# Patient Record
Sex: Male | Born: 1990 | Race: Black or African American | Hispanic: No | Marital: Single | Smoking: Never smoker
Health system: Southern US, Community
[De-identification: ages and names within clinical notes are randomized; demographics above are authoritative.]

## PROBLEM LIST (undated history)

## (undated) DIAGNOSIS — L309 Dermatitis, unspecified: Secondary | ICD-10-CM

## (undated) DIAGNOSIS — J45909 Unspecified asthma, uncomplicated: Secondary | ICD-10-CM

## (undated) HISTORY — PX: SINUS EXPLORATION: SHX5214

---

## 2010-12-06 ENCOUNTER — Inpatient Hospital Stay (INDEPENDENT_AMBULATORY_CARE_PROVIDER_SITE_OTHER)
Admission: RE | Admit: 2010-12-06 | Discharge: 2010-12-06 | Disposition: A | Discharge status: Home / Self Care | Payer: PRIVATE HEALTH INSURANCE | Source: Ambulatory Visit | Attending: Family Medicine | Admitting: Family Medicine

## 2010-12-06 ENCOUNTER — Ambulatory Visit (INDEPENDENT_AMBULATORY_CARE_PROVIDER_SITE_OTHER): Payer: PRIVATE HEALTH INSURANCE

## 2010-12-06 DIAGNOSIS — J40 Bronchitis, not specified as acute or chronic: Secondary | ICD-10-CM

## 2010-12-06 LAB — CBC
HCT: 41.4 % (ref 39.0–52.0)
MCV: 88.3 fL (ref 78.0–100.0)
RDW: 12.7 % (ref 11.5–15.5)
WBC: 11.8 10*3/uL — ABNORMAL HIGH (ref 4.0–10.5)

## 2010-12-06 LAB — DIFFERENTIAL
Eosinophils Relative: 0 % (ref 0–5)
Lymphocytes Relative: 4 % — ABNORMAL LOW (ref 12–46)
Lymphs Abs: 0.5 10*3/uL — ABNORMAL LOW (ref 0.7–4.0)
Monocytes Absolute: 0.6 10*3/uL (ref 0.1–1.0)

## 2013-04-12 ENCOUNTER — Emergency Department (HOSPITAL_COMMUNITY)
Admission: EM | Admit: 2013-04-12 | Discharge: 2013-04-12 | Disposition: A | Discharge status: Home / Self Care | Payer: PRIVATE HEALTH INSURANCE | Source: Home / Self Care | Attending: Family Medicine | Admitting: Family Medicine

## 2013-04-12 ENCOUNTER — Encounter (HOSPITAL_COMMUNITY): Payer: Self-pay | Admitting: Emergency Medicine

## 2013-04-12 DIAGNOSIS — L209 Atopic dermatitis, unspecified: Secondary | ICD-10-CM

## 2013-04-12 DIAGNOSIS — L2089 Other atopic dermatitis: Secondary | ICD-10-CM

## 2013-04-12 HISTORY — DX: Unspecified asthma, uncomplicated: J45.909

## 2013-04-12 HISTORY — DX: Dermatitis, unspecified: L30.9

## 2013-04-12 MED ORDER — PREDNISONE 20 MG PO TABS: 40.0000 mg | ORAL_TABLET | Freq: Every day | ORAL | Status: AC

## 2013-04-12 MED ORDER — TRIAMCINOLONE ACETONIDE 0.1 % EX CREA: TOPICAL_CREAM | Freq: Two times a day (BID) | CUTANEOUS | Status: DC

## 2013-04-12 NOTE — ED Provider Notes (Signed)
   History    CSN: [id number] Arrival date & time 04/12/13  1636  First MD Initiated Contact with Patient 04/12/13 1728     Chief Complaint  Patient presents with  . Eczema    flare up symptoms started saturday. c/o severe itching and skin tighting.    (Consider location/radiation/quality/duration/timing/severity/associated sxs/prior Treatment) HPI Comments: Dustin Shepherd is a 22 year old male with a history of atopic dermatitis and asthma. He is followed by a dermatologist at [facility] who is currently treating him with tacrolimus. He presents today with 2 days of face neck and chest itching. This is associated with an erythematous rash. He admits to attending a cook out over weekend and possibly being exposed to outdoor irritants. Along with the pruritus associated symptoms include skin warmth and mild shortness of breath He denies fever and tongue or lip swelling.   He is taking Benadryl with improvement in periorbital swelling and itching.   The history is provided by the patient. No language interpreter was used.   Past Medical History  Diagnosis Date  . Asthma   . Eczema    Past Surgical History  Procedure Laterality Date  . Sinus exploration     History reviewed. No pertinent family history. History  Substance Use Topics  . Smoking status: Never Smoker   . Smokeless tobacco: Not on file  . Alcohol Use: Yes    Review of Systems  Constitutional: Negative.   Respiratory: Positive for shortness of breath. Negative for cough.   Skin: Positive for rash.    Allergies  Keflex and Peanuts  Home Medications   Current Outpatient Rx  Name  Route  Sig  Dispense  Refill  . ALBUTEROL SULFATE ER PO   Oral   Take by mouth.         . Tacrolimus (PROTOPIC EX)   Apply externally   Apply topically.          BP 136/84  Pulse 64  Temp(Src) 98 F (36.7 C) (Oral)  Resp 14  SpO2 100% Physical Exam  Constitutional: He appears well-developed and well-nourished. No distress.   Skin: Rash noted. There is erythema.   patient's skin on his face, neck and trunk is diffusely atrophic and erythematous. There is evidence of excoriation. There are no pustules are weeping areas  ED Course  Procedures (including critical care time) Labs Reviewed - No data to display No results found. No diagnosis found.  MDM  Atopic dermatitis with acute flare and no evidence of bacterial super infection. Treat the patient with prednisone 40 mg daily for the next week. He to followup with his primary dermatologist to determine the 40 mg he is to be continued or taper to be started. Have also prescribed a moderate potency topical corticosteroid ointment. Patient encouraged to continue Benadryl nightly. Patient caution against scratching as that increases her risk of bacterial superinfection.  Dessa Phi, MD 04/12/13 1819

## 2013-04-12 NOTE — ED Notes (Signed)
Pt c/o eczema flare up with severe itching and skin tightness.  Pt denies change in diet or any soaps/detergents.  Hx of eczema  Pt states that he took benedryl that usually helps but it did not work this time.

## 2013-04-12 NOTE — ED Notes (Signed)
Waiting discharge papers 

## 2013-04-26 NOTE — ED Provider Notes (Signed)
Medical screening examination/treatment/procedure(s) were performed by resident physician or non-physician practitioner and as supervising physician I was immediately available for consultation/collaboration.   Koua Deeg DOUGLAS MD.   Bradden Tadros D Saanya Zieske, MD 04/26/13 0841 

## 2013-07-13 ENCOUNTER — Emergency Department (HOSPITAL_COMMUNITY)
Admission: EM | Admit: 2013-07-13 | Discharge: 2013-07-13 | Disposition: A | Discharge status: Home / Self Care | Payer: PRIVATE HEALTH INSURANCE | Source: Home / Self Care | Attending: Family Medicine | Admitting: Family Medicine

## 2013-07-13 ENCOUNTER — Encounter (HOSPITAL_COMMUNITY): Payer: Self-pay | Admitting: Emergency Medicine

## 2013-07-13 DIAGNOSIS — J302 Other seasonal allergic rhinitis: Secondary | ICD-10-CM

## 2013-07-13 DIAGNOSIS — J309 Allergic rhinitis, unspecified: Secondary | ICD-10-CM

## 2013-07-13 DIAGNOSIS — J45901 Unspecified asthma with (acute) exacerbation: Secondary | ICD-10-CM

## 2013-07-13 MED ORDER — IPRATROPIUM BROMIDE 0.02 % IN SOLN
0.5000 mg | Freq: Once | RESPIRATORY_TRACT | Status: AC
  Administered 2013-07-13: 0.5 mg via RESPIRATORY_TRACT

## 2013-07-13 MED ORDER — METHYLPREDNISOLONE ACETATE 40 MG/ML IJ SUSP
80.0000 mg | Freq: Once | INTRAMUSCULAR | Status: AC
  Administered 2013-07-13: 80 mg via INTRAMUSCULAR

## 2013-07-13 MED ORDER — ALBUTEROL SULFATE HFA 108 (90 BASE) MCG/ACT IN AERS: 2.0000 | INHALATION_SPRAY | Freq: Four times a day (QID) | RESPIRATORY_TRACT | Status: AC | PRN

## 2013-07-13 MED ORDER — FLUTICASONE PROPIONATE 50 MCG/ACT NA SUSP: 2.0000 | Freq: Two times a day (BID) | NASAL | Status: AC

## 2013-07-13 MED ORDER — METHYLPREDNISOLONE ACETATE 80 MG/ML IJ SUSP
INTRAMUSCULAR | Status: AC
  Filled 2013-07-13: qty 1

## 2013-07-13 MED ORDER — TRIAMCINOLONE ACETONIDE 40 MG/ML IJ SUSP
INTRAMUSCULAR | Status: AC
  Filled 2013-07-13: qty 1

## 2013-07-13 MED ORDER — ALBUTEROL SULFATE (5 MG/ML) 0.5% IN NEBU
5.0000 mg | INHALATION_SOLUTION | Freq: Once | RESPIRATORY_TRACT | Status: AC
  Administered 2013-07-13: 5 mg via RESPIRATORY_TRACT

## 2013-07-13 MED ORDER — ALBUTEROL SULFATE (5 MG/ML) 0.5% IN NEBU
INHALATION_SOLUTION | RESPIRATORY_TRACT | Status: AC
  Filled 2013-07-13: qty 1

## 2013-07-13 MED ORDER — TRIAMCINOLONE ACETONIDE 40 MG/ML IJ SUSP
40.0000 mg | Freq: Once | INTRAMUSCULAR | Status: AC
  Administered 2013-07-13: 40 mg via INTRAMUSCULAR

## 2013-07-13 NOTE — ED Provider Notes (Signed)
CSN: [id number]     Arrival date & time 07/13/13  1805 History   First MD Initiated Contact with Patient 07/13/13 1855     Chief Complaint  Patient presents with  . URI   (Consider location/radiation/quality/duration/timing/severity/associated sxs/prior Treatment) Patient is a 22 y.o. male presenting with URI. The history is provided by the patient.  URI Presenting symptoms: congestion, cough and rhinorrhea   Presenting symptoms: no fever   Severity:  Mild Onset quality:  Gradual Duration:  2 days Timing:  Sporadic Progression:  Waxing and waning Chronicity:  Chronic Relieved by:  Nebulizer treatments Associated symptoms: wheezing     Past Medical History  Diagnosis Date  . Asthma   . Eczema    Past Surgical History  Procedure Laterality Date  . Sinus exploration     History reviewed. No pertinent family history. History  Substance Use Topics  . Smoking status: Never Smoker   . Smokeless tobacco: Not on file  . Alcohol Use: Yes    Review of Systems  Constitutional: Negative.  Negative for fever.  HENT: Positive for congestion, rhinorrhea and postnasal drip.   Respiratory: Positive for cough and wheezing.     Allergies  Keflex and Peanuts  Home Medications   Current Outpatient Rx  Name  Route  Sig  Dispense  Refill  . albuterol (PROVENTIL HFA;VENTOLIN HFA) 108 (90 BASE) MCG/ACT inhaler   Inhalation   Inhale 2 puffs into the lungs every 6 (six) hours as needed for wheezing.   1 Inhaler   0   . ALBUTEROL SULFATE ER PO   Oral   Take by mouth.         . fluticasone (FLONASE) 50 MCG/ACT nasal spray   Nasal   Place 2 sprays into the nose 2 (two) times daily.   1 g   2   . Tacrolimus (PROTOPIC EX)   Apply externally   Apply topically.         . triamcinolone cream (KENALOG) 0.1 %   Topical   Apply topically 2 (two) times daily.   85.2 g   0    BP 108/69  Pulse 78  Temp(Src) 98.2 F (36.8 C) (Oral)  Resp 21  SpO2 99% Physical Exam   Nursing note and vitals reviewed. Constitutional: He is oriented to person, place, and time. He appears well-developed and well-nourished.  HENT:  Head: Normocephalic.  Right Ear: External ear normal.  Left Ear: External ear normal.  Mouth/Throat: Oropharynx is clear and moist.  Eyes: Pupils are equal, round, and reactive to light.  Neck: Normal range of motion. Neck supple.  Cardiovascular: Normal rate, regular rhythm, normal heart sounds and intact distal pulses.   Pulmonary/Chest: Effort normal and breath sounds normal. He has no wheezes.  Lymphadenopathy:    He has no cervical adenopathy.  Neurological: He is alert and oriented to person, place, and time.  Skin: Skin is warm and dry.    ED Course  Procedures (including critical care time) Labs Review Labs Reviewed - No data to display Imaging Review No results found.  MDM      Linna Hoff, MD 07/13/13 [id number]

## 2013-07-13 NOTE — ED Notes (Signed)
Pt c/o of sx including productive cough with sneezing and congestion x 2 days. Pt reports sx are exacerbating his asthma to the point when he wakes up from sleeping. Last used his pulmacort this afternoon around 4 pm. Pt denies fever and n/v/d/. Pt is alert and in no acute distress.

## 2013-12-09 ENCOUNTER — Emergency Department (HOSPITAL_COMMUNITY)
Admission: EM | Admit: 2013-12-09 | Discharge: 2013-12-09 | Disposition: A | Discharge status: Home / Self Care | Payer: PRIVATE HEALTH INSURANCE | Source: Home / Self Care

## 2013-12-09 ENCOUNTER — Encounter (HOSPITAL_COMMUNITY): Payer: Self-pay | Admitting: Emergency Medicine

## 2013-12-09 DIAGNOSIS — J45901 Unspecified asthma with (acute) exacerbation: Secondary | ICD-10-CM

## 2013-12-09 MED ORDER — IPRATROPIUM BROMIDE 0.02 % IN SOLN
RESPIRATORY_TRACT | Status: AC
  Filled 2013-12-09: qty 2.5

## 2013-12-09 MED ORDER — ALBUTEROL SULFATE HFA 108 (90 BASE) MCG/ACT IN AERS: 1.0000 | INHALATION_SPRAY | Freq: Four times a day (QID) | RESPIRATORY_TRACT | Status: AC | PRN

## 2013-12-09 MED ORDER — IPRATROPIUM BROMIDE 0.02 % IN SOLN
0.5000 mg | Freq: Once | RESPIRATORY_TRACT | Status: AC
  Administered 2013-12-09: 0.5 mg via RESPIRATORY_TRACT

## 2013-12-09 MED ORDER — ALBUTEROL SULFATE (2.5 MG/3ML) 0.083% IN NEBU
5.0000 mg | INHALATION_SOLUTION | Freq: Once | RESPIRATORY_TRACT | Status: AC
  Administered 2013-12-09: 5 mg via RESPIRATORY_TRACT

## 2013-12-09 MED ORDER — ALBUTEROL SULFATE (2.5 MG/3ML) 0.083% IN NEBU
INHALATION_SOLUTION | RESPIRATORY_TRACT | Status: AC
  Filled 2013-12-09: qty 6

## 2013-12-09 NOTE — Discharge Instructions (Signed)
Asthma, Adult °Asthma is a recurring condition in which the airways tighten and narrow. Asthma can make it difficult to breathe. It can cause coughing, wheezing, and shortness of breath. Asthma episodes (also called asthma attacks) range from minor to life-threatening. Asthma cannot be cured, but medicines and lifestyle changes can help control it. °CAUSES °Asthma is believed to be caused by inherited (genetic) and environmental factors, but its exact cause is unknown. Asthma may be triggered by allergens, lung infections, or irritants in the air. Asthma triggers are different for each person. Common triggers include:  °· Animal dander. °· Dust mites. °· Cockroaches. °· Pollen from trees or grass. °· Mold. °· Smoke. °· Air pollutants such as dust, household cleaners, hair sprays, aerosol sprays, paint fumes, strong chemicals, or strong odors. °· Cold air, weather changes, and winds (which increase molds and pollens in the air). °· Strong emotional expressions such as crying or laughing hard. °· Stress. °· Certain medicines (such as aspirin) or types of drugs (such as beta-blockers). °· Sulfites in foods and drinks. Foods and drinks that may contain sulfites include dried fruit, potato chips, and sparkling grape juice. °· Infections or inflammatory conditions such as the flu, a cold, or an inflammation of the nasal membranes (rhinitis). °· Gastroesophageal reflux disease (GERD). °· Exercise or strenuous activity. °SYMPTOMS °Symptoms may occur immediately after asthma is triggered or many hours later. Symptoms include: °· Wheezing. °· Excessive nighttime or early morning coughing. °· Frequent or severe coughing with a common cold. °· Chest tightness. °· Shortness of breath. °DIAGNOSIS  °The diagnosis of asthma is made by a review of your medical history and a physical exam. Tests may also be performed. These may include: °· Lung function studies. These tests show how much air you breath in and out. °· Allergy  tests. °· Imaging tests such as X-rays. °TREATMENT  °Asthma cannot be cured, but it can usually be controlled. Treatment involves identifying and avoiding your asthma triggers. It also involves medicines. There are 2 classes of medicine used for asthma treatment:  °· Controller medicines. These prevent asthma symptoms from occurring. They are usually taken every day. °· Reliever or rescue medicines. These quickly relieve asthma symptoms. They are used as needed and provide short-term relief. °Your health care provider will help you create an asthma action plan. An asthma action plan is a written plan for managing and treating your asthma attacks. It includes a list of your asthma triggers and how they may be avoided. It also includes information on when medicines should be taken and when their dosage should be changed. An action plan may also involve the use of a device called a peak flow meter. A peak flow meter measures how well the lungs are working. It helps you monitor your condition. °HOME CARE INSTRUCTIONS  °· Take medicine as directed by your health care provider. Speak with your health care provider if you have questions about how or when to take the medicines. °· Use a peak flow meter as directed by your health care provider. Record and keep track of readings. °· Understand and use the action plan to help minimize or stop an asthma attack without needing to seek medical care. °· Control your home environment in the following ways to help prevent asthma attacks: °· Do not smoke. Avoid being exposed to secondhand smoke. °· Change your heating and air conditioning filter regularly. °· Limit your use of fireplaces and wood stoves. °· Get rid of pests (such as roaches and   mice) and their droppings. °· Throw away plants if you see mold on them. °· Clean your floors and dust regularly. Use unscented cleaning products. °· Try to have someone else vacuum for you regularly. Stay out of rooms while they are being  vacuumed and for a short while afterward. If you vacuum, use a dust mask from a hardware store, a double-layered or microfilter vacuum cleaner bag, or a vacuum cleaner with a HEPA filter. °· Replace carpet with wood, tile, or vinyl flooring. Carpet can trap dander and dust. °· Use allergy-proof pillows, mattress covers, and box spring covers. °· Wash bed sheets and blankets every week in hot water and dry them in a dryer. °· Use blankets that are made of polyester or cotton. °· Clean bathrooms and kitchens with bleach. If possible, have someone repaint the walls in these rooms with mold-resistant paint. Keep out of the rooms that are being cleaned and painted. °· Wash hands frequently. °SEEK MEDICAL CARE IF:  °· You have wheezing, shortness of breath, or a cough even if taking medicine to prevent attacks. °· The colored mucus you cough up (sputum) is thicker than usual. °· Your sputum changes from clear or white to yellow, green, gray, or bloody. °· You have any problems that may be related to the medicines you are taking (such as a rash, itching, swelling, or trouble breathing). °· You are using a reliever medicine more than 2 3 times per week. °· Your peak flow is still at 50 79% of you personal best after following your action plan for 1 hour. °SEEK IMMEDIATE MEDICAL CARE IF:  °· You seem to be getting worse and are unresponsive to treatment during an asthma attack. °· You are short of breath even at rest. °· You get short of breath when doing very little physical activity. °· You have difficulty eating, drinking, or talking due to asthma symptoms. °· You develop chest pain. °· You develop a fast heartbeat. °· You have a bluish color to your lips or fingernails. °· You are lightheaded, dizzy, or faint. °· Your peak flow is less than 50% of your personal best. °· You have a fever or persistent symptoms for more than 2 3 days. °· You have a fever and symptoms suddenly get worse. °MAKE SURE YOU:  °· Understand these  instructions. °· Will watch your condition. °· Will get help right away if you are not doing well or get worse. °Document Released: 10/06/2005 Document Revised: 06/08/2013 Document Reviewed: 05/05/2013 °[employer]® Patient Information ©2014 [employer]. ° °Asthma Attack Prevention °Although there is no way to prevent asthma from starting, you can take steps to control the disease and reduce its symptoms. Learn about your asthma and how to control it. Take an active role to control your asthma by working with your health care provider to create and follow an asthma action plan. An asthma action plan guides you in: °· Taking your medicines properly. °· Avoiding things that set off your asthma or make your asthma worse (asthma triggers). °· Tracking your level of asthma control. °· Responding to worsening asthma. °· Seeking emergency care when needed. °To track your asthma, keep records of your symptoms, check your peak flow number using a handheld device that shows how well air moves out of your lungs (peak flow meter), and get regular asthma checkups.  °WHAT ARE SOME WAYS TO PREVENT AN ASTHMA ATTACK? °· Take medicines as directed by your health care provider. °· Keep track of your asthma   symptoms and level of control. °· With your health care provider, write a detailed plan for taking medicines and managing an asthma attack. Then be sure to follow your action plan. Asthma is an ongoing condition that needs regular monitoring and treatment. °· Identify and avoid asthma triggers. Many outdoor allergens and irritants (such as pollen, mold, cold air, and air pollution) can trigger asthma attacks. Find out what your asthma triggers are and take steps to avoid them. °· Monitor your breathing. Learn to recognize warning signs of an attack, such as coughing, wheezing, or shortness of breath. Your lung function may decrease before you notice any signs or symptoms, so regularly measure and record your peak airflow with a home  peak flow meter. °· Identify and treat attacks early. If you act quickly, you are less likely to have a severe attack. You will also need less medicine to control your symptoms. When your peak flow measurements decrease and alert you to an upcoming attack, take your medicine as instructed and immediately stop any activity that may have triggered the attack. If your symptoms do not improve, get medical help. °· Pay attention to increasing quick-relief inhaler use. If you find yourself relying on your quick-relief inhaler, your asthma is not under control. See your health care provider about adjusting your treatment. °WHAT CAN MAKE MY SYMPTOMS WORSE? °A number of common things can set off or make your asthma symptoms worse and cause temporary increased inflammation of your airways. Keep track of your asthma symptoms for several weeks, detailing all the environmental and emotional factors that are linked with your asthma. When you have an asthma attack, go back to your asthma diary to see which factor, or combination of factors, might have contributed to it. Once you know what these factors are, you can take steps to control many of them. If you have allergies and asthma, it is important to take asthma prevention steps at home. Minimizing contact with the substance to which you are allergic will help prevent an asthma attack. Some triggers and ways to avoid these triggers are: °Animal Dander:  °Some people are allergic to the flakes of skin or dried saliva from animals with fur or feathers.  °· There is no such thing as a hypoallergenic dog or cat breed. All dogs or cats can cause allergies, even if they don't shed. °· Keep these pets out of your home. °· If you are not able to keep a pet outdoors, keep the pet out of your bedroom and other sleeping areas at all times, and keep the door closed. °· Remove carpets and furniture covered with cloth from your home. If that is not possible, keep the pet away from  fabric-covered furniture and carpets. °Dust Mites: °Many people with asthma are allergic to dust mites. Dust mites are tiny bugs that are found in every home in mattresses, pillows, carpets, fabric-covered furniture, bedcovers, clothes, stuffed toys, and other fabric-covered items.  °· Cover your mattress in a special dust-proof cover. °· Cover your pillow in a special dust-proof cover, or wash the pillow each week in hot water. Water must be hotter than 130° F (54.4° C) to kill dust mites. Cold or warm water used with detergent and bleach can also be effective. °· Wash the sheets and blankets on your bed each week in hot water. °· Try not to sleep or lie on cloth-covered cushions. °· Call ahead when traveling and ask for a smoke-free hotel room. Bring your own bedding and pillows   in case the hotel only supplies feather pillows and down comforters, which may contain dust mites and cause asthma symptoms. °· Remove carpets from your bedroom and those laid on concrete, if you can. °· Keep stuffed toys out of the bed, or wash the toys weekly in hot water or cooler water with detergent and bleach. °Cockroaches: °Many people with asthma are allergic to the droppings and remains of cockroaches.  °· Keep food and garbage in closed containers. Never leave food out. °· Use poison baits, traps, powders, gels, or paste (for example, boric acid). °· If a spray is used to kill cockroaches, stay out of the room until the odor goes away. °Indoor Mold: °· Fix leaky faucets, pipes, or other sources of water that have mold around them. °· Clean floors and moldy surfaces with a fungicide or diluted bleach. °· Avoid using humidifiers, vaporizers, or swamp coolers. These can spread molds through the air. °Pollen and Outdoor Mold: °· When pollen or mold spore counts are high, try to keep your windows closed. °· Stay indoors with windows closed from late morning to afternoon. Pollen and some mold spore counts are highest at that  time. °· Ask your health care provider whether you need to take anti-inflammatory medicine or increase your dose of the medicine before your allergy season starts. °Other Irritants to Avoid: °· Tobacco smoke is an irritant. If you smoke, ask your health care provider how you can quit. Ask family members to quit smoking too. Do not allow smoking in your home or car. °· If possible, do not use a wood-burning stove, kerosene heater, or fireplace. Minimize exposure to all sources of smoke, including to incense, candles, fires, and fireworks. °· Try to stay away from strong odors and sprays, such as perfume, talcum powder, hair spray, and paints. °· Decrease humidity in your home and use an indoor air cleaning device. Reduce indoor humidity to below 60%. Dehumidifiers or central air conditioners can do this. °· Decrease house dust exposure by changing furnace and air cooler filters frequently. °· Try to have someone else vacuum for you once or twice a week. Stay out of rooms while they are being vacuumed and for a short while afterward. °· If you vacuum, use a dust mask from a hardware store, a double-layered or microfilter vacuum cleaner bag, or a vacuum cleaner with a HEPA filter. °· Sulfites in foods and beverages can be irritants. Do not drink beer or wine or eat dried fruit, processed potatoes, or shrimp if they cause asthma symptoms. °· Cold air can trigger an asthma attack. Cover your nose and mouth with a scarf on cold or windy days. °· Several health conditions can make asthma more difficult to manage, including a runny nose, sinus infections, reflux disease, psychological stress, and sleep apnea. Work with your health care provider to manage these conditions. °· Avoid close contact with people who have a respiratory infection such as a cold or the flu, since your asthma symptoms may get worse if you catch the infection. Wash your hands thoroughly after touching items that may have been handled by people with a  respiratory infection. °· Get a flu shot every year to protect against the flu virus, which often makes asthma worse for days or weeks. Also get a pneumonia shot if you have not previously had one. Unlike the flu shot, the pneumonia shot does not need to be given yearly. °Medicines: °· Talk to your health care provider about whether it is safe   for you to take aspirin or non-steroidal anti-inflammatory medicines (NSAIDs). In a small number of people with asthma, aspirin and NSAIDs can cause asthma attacks. These medicines must be avoided by people who have known aspirin-sensitive asthma. It is important that people with aspirin-sensitive asthma read labels of all over-the-counter medicines used to treat pain, colds, coughs, and fever. °· Beta blockers and ACE inhibitors are other medicines you should discuss with your health care provider. °HOW CAN I FIND OUT WHAT I AM ALLERGIC TO? °Ask your asthma health care provider about allergy skin testing or blood testing (the RAST test) to identify the allergens to which you are sensitive. If you are found to have allergies, the most important thing to do is to try to avoid exposure to any allergens that you are sensitive to as much as possible. Other treatments for allergies, such as medicines and allergy shots (immunotherapy) are available.  °CAN I EXERCISE? °Follow your health care provider's advice regarding asthma treatment before exercising. It is important to maintain a regular exercise program, but vigorous exercise, or exercise in cold, humid, or dry environments can cause asthma attacks, especially for those people who have exercise-induced asthma. °Document Released: 09/24/2009 Document Revised: 06/08/2013 Document Reviewed: 04/13/2013 °[employer]® Patient Information ©2014 [employer]. ° °

## 2013-12-09 NOTE — ED Notes (Signed)
Has reported history of asthma, w c/o flare up after he ran out of his MDI. Auscultated wheezing bilateral , and wants to be sure he is not developing pneumonia

## 2013-12-09 NOTE — ED Provider Notes (Signed)
CSN: [id number]     Arrival date & time 12/09/13  4098 History   None    Chief Complaint  Patient presents with  . Cough     (Consider location/radiation/quality/duration/timing/severity/associated sxs/prior Treatment) HPI Comments: Patient presents with cough and wheezing that began last night. 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 Denies fever or malaise. No chest pain. Patient is non-smoker. PCP: none, but is student at [employer] and has used campus student health services in past  Patient is a 23 y.o. male presenting with cough. The history is provided by the patient.  Cough   Past Medical History  Diagnosis Date  . Asthma   . Eczema    Past Surgical History  Procedure Laterality Date  . Sinus exploration     History reviewed. No pertinent family history. History  Substance Use Topics  . Smoking status: Never Smoker   . Smokeless tobacco: Not on file  . Alcohol Use: Yes    Review of Systems  Respiratory: Positive for cough.   All other systems reviewed and are negative.      Allergies  Keflex and Peanuts  Home Medications   Current Outpatient Rx  Name  Route  Sig  Dispense  Refill  . albuterol (PROVENTIL HFA;VENTOLIN HFA) 108 (90 BASE) MCG/ACT inhaler   Inhalation   Inhale 2 puffs into the lungs every 6 (six) hours as needed for wheezing.   1 Inhaler   0   . albuterol (PROVENTIL HFA;VENTOLIN HFA) 108 (90 BASE) MCG/ACT inhaler   Inhalation   Inhale 1-2 puffs into the lungs every 6 (six) hours as needed for wheezing or shortness of breath.   1 Inhaler   0   . ALBUTEROL SULFATE ER PO   Oral   Take by mouth.         . fluticasone (FLONASE) 50 MCG/ACT nasal spray   Nasal   Place 2 sprays into the nose 2 (two) times daily.   1 g   2   . Tacrolimus (PROTOPIC EX)   Apply externally   Apply topically.        . triamcinolone cream (KENALOG) 0.1 %   Topical   Apply topically 2 (two) times daily.   85.2 g   0    BP 123/90  Pulse 74  Temp(Src) 98.6 F (37 C) (Oral)  Resp 16  SpO2 100% Physical Exam  Nursing note and vitals reviewed. Constitutional: He is oriented to person, place, and time. He appears well-developed and well-nourished. No distress.  Eyes: Conjunctivae are normal. No scleral icterus.  Neck: Normal range of motion. Neck supple.  Cardiovascular: Normal rate, regular rhythm and normal heart sounds.   Pulmonary/Chest: Effort normal. No respiratory distress. He has wheezes. He has no rales. He exhibits no tenderness.  No prolonged expiratory phase or accessory muscle use.  Musculoskeletal: Normal range of motion.  Lymphadenopathy:    He has no cervical adenopathy.  Neurological: He is alert and oriented to person, place, and time.  Skin: Skin is warm and dry. No rash noted.  Psychiatric: He has a normal mood and affect. His behavior is normal.    ED Course  Procedures (including critical care time) Labs Review Labs Reviewed - No data to display Imaging Review No results found.    MDM   Final diagnoses:  Asthma exacerbation  Mild asthma exacerbation. [facility] and then Rx for albuterol [facility] for home.     Jess BartersJennifer Lee Sh[place], [place] 12/09/13 [phone]

## 2013-12-09 NOTE — ED Provider Notes (Signed)
Medical screening examination/treatment/procedure(s) were performed by resident physician or non-physician practitioner and as supervising physician I was immediately available for consultation/collaboration.   Rutledge Selsor DOUGLAS MD.   Leonette Tischer D Shynia Daleo, MD 12/09/13 1432 

## 2014-03-10 ENCOUNTER — Encounter (HOSPITAL_COMMUNITY): Payer: Self-pay | Admitting: Emergency Medicine

## 2014-03-10 ENCOUNTER — Emergency Department (INDEPENDENT_AMBULATORY_CARE_PROVIDER_SITE_OTHER)
Admission: EM | Admit: 2014-03-10 | Discharge: 2014-03-10 | Disposition: A | Discharge status: Home / Self Care | Payer: PRIVATE HEALTH INSURANCE | Source: Home / Self Care

## 2014-03-10 DIAGNOSIS — L309 Dermatitis, unspecified: Secondary | ICD-10-CM

## 2014-03-10 DIAGNOSIS — L259 Unspecified contact dermatitis, unspecified cause: Secondary | ICD-10-CM

## 2014-03-10 MED ORDER — METHYLPREDNISOLONE 4 MG PO KIT: PACK | ORAL | Status: AC

## 2014-03-10 MED ORDER — TRIAMCINOLONE ACETONIDE 0.1 % EX CREA: 1.0000 "application " | TOPICAL_CREAM | Freq: Two times a day (BID) | CUTANEOUS | Status: AC

## 2014-03-10 NOTE — Discharge Instructions (Signed)
Eczema Eczema, also called atopic dermatitis, is a skin disorder that causes inflammation of the skin. It causes a red rash and dry, scaly skin. The skin becomes very itchy. Eczema is generally worse during the cooler winter months and often improves with the warmth of summer. Eczema usually starts showing signs in infancy. Some children outgrow eczema, but it may last through adulthood.  CAUSES  The exact cause of eczema is not known, but it appears to run in families. People with eczema often have a family history of eczema, allergies, asthma, or hay fever. Eczema is not contagious. Flare-ups of the condition may be caused by:   Contact with something you are sensitive or allergic to.   Stress. SIGNS AND SYMPTOMS  Dry, scaly skin.   Red, itchy rash.   Itchiness. This may occur before the skin rash and may be very intense.  DIAGNOSIS  The diagnosis of eczema is usually made based on symptoms and medical history. TREATMENT  Eczema cannot be cured, but symptoms usually can be controlled with treatment and other strategies. A treatment plan might include:  Controlling the itching and scratching.   Use over-the-counter antihistamines as directed for itching. This is especially useful at night when the itching tends to be worse.   Use over-the-counter steroid creams as directed for itching.   Avoid scratching. Scratching makes the rash and itching worse. It may also result in a skin infection (impetigo) due to a break in the skin caused by scratching.   Keeping the skin well moisturized with creams every day. This will seal in moisture and help prevent dryness. Lotions that contain alcohol and water should be avoided because they can dry the skin.   Limiting exposure to things that you are sensitive or allergic to (allergens).   Recognizing situations that cause stress.   Developing a plan to manage stress.  HOME CARE INSTRUCTIONS   Only take over-the-counter or  prescription medicines as directed by your health care provider.   Do not use anything on the skin without checking with your health care provider.   Keep baths or showers short (5 minutes) in warm (not hot) water. Use mild cleansers for bathing. These should be unscented. You may add nonperfumed bath oil to the bath water. It is best to avoid soap and bubble bath.   Immediately after a bath or shower, when the skin is still damp, apply a moisturizing ointment to the entire body. This ointment should be a petroleum ointment. This will seal in moisture and help prevent dryness. The thicker the ointment, the better. These should be unscented.   Keep fingernails cut short. Children with eczema may need to wear soft gloves or mittens at night after applying an ointment.   Dress in clothes made of cotton or cotton blends. Dress lightly, because heat increases itching.   A child with eczema should stay away from anyone with fever blisters or cold sores. The virus that causes fever blisters (herpes simplex) can cause a serious skin infection in children with eczema. SEEK MEDICAL CARE IF:   Your itching interferes with sleep.   Your rash gets worse or is not better within 1 week after starting treatment.   You see pus or soft yellow scabs in the rash area.   You have a fever.   You have a rash flare-up after contact with someone who has fever blisters.  Document Released: 10/03/2000 Document Revised: 07/27/2013 Document Reviewed: 05/09/2013 [employer] Patient Information 2014 [employer].  

## 2014-03-10 NOTE — ED Notes (Signed)
Pt c/o eczema flare up onset 1 week Was treating it w/hdyroxizine, protopic and betamethasone w/no relief Denies fevers; alert w/no signs of acute distress.

## 2014-03-10 NOTE — ED Provider Notes (Signed)
CSN: [id number]     Arrival date & time 03/10/14  1107 History   First MD Initiated Contact with Patient 03/10/14 1209     Chief Complaint  Patient presents with  . Eczema   (Consider location/radiation/quality/duration/timing/severity/associated sxs/prior Treatment) HPI Comments: Lifetime hx of eczema. Has Dermatologist in [place] but missed his Spring appt. C/O recent flare up where most BSA's are affected.    Past Medical History  Diagnosis Date  . Asthma   . Eczema    Past Surgical History  Procedure Laterality Date  . Sinus exploration     No family history on file. History  Substance Use Topics  . Smoking status: Never Smoker   . Smokeless tobacco: Not on file  . Alcohol Use: Yes    Review of Systems  Respiratory: Negative.   Gastrointestinal: Negative.   Genitourinary: Negative.   Skin: Positive for rash.  All other systems reviewed and are negative.   Allergies  Keflex and Peanuts  Home Medications   Prior to Admission medications   Medication Sig Start Date End Date Taking? Authorizing Provider  albuterol (PROVENTIL HFA;VENTOLIN HFA) 108 (90 BASE) MCG/ACT inhaler Inhale 2 puffs into the lungs every 6 (six) hours as needed for wheezing. 07/13/13  Yes Linna Hoff, MD  albuterol (PROVENTIL HFA;VENTOLIN HFA) 108 (90 BASE) MCG/ACT inhaler Inhale 1-2 puffs into the lungs every 6 (six) hours as needed for wheezing or shortness of breath. 12/09/13  Yes Jess Barters Presson, PA  Tacrolimus (PROTOPIC EX) Apply topically.   Yes Historical Provider, MD  ALBUTEROL SULFATE ER PO Take by mouth.    Historical Provider, MD  fluticasone (FLONASE) 50 MCG/ACT nasal spray Place 2 sprays into the nose 2 (two) times daily. 07/13/13   Linna Hoff, MD  triamcinolone cream (KENALOG) 0.1 % Apply topically 2 (two) times daily. 04/12/13   Josalyn C Funches, MD   BP 121/72  Pulse 90  Temp(Src) 97.7 F (36.5 C) (Oral)  Resp 14  SpO2 100% Physical Exam  Nursing note and vitals  reviewed. Constitutional: He is oriented to person, place, and time. He appears well-developed and well-nourished. No distress.  Neck: Normal range of motion. Neck supple.  Pulmonary/Chest: Effort normal.  Neurological: He is alert and oriented to person, place, and time.  Skin: Skin is warm and dry.  Scalp, face, torso and extremities all affected with scaly papular, rough, pruritic rash consistent with his long hx of eczema.    Psychiatric: He has a normal mood and affect.    ED Course  Procedures (including critical care time) Labs Review Labs Reviewed - No data to display  Imaging Review No results found.   MDM   1. Chronic eczema     Follow instructions given Medrol dose Trimacinolone cream as dir bid Call your [facility] Dr. For appt ASAP    Hayden Rasmussen, NP 03/10/14 1228

## 2014-03-11 NOTE — ED Provider Notes (Signed)
Medical screening examination/treatment/procedure(s) were performed by resident physician or non-physician practitioner and as supervising physician I was immediately available for consultation/collaboration.   Barkley Bruns MD.   Linna Hoff, MD 03/11/14 [phone]
